# Patient Record
Sex: Female | Born: 1975 | Hispanic: No | Marital: Married | Smoking: Current every day smoker
Health system: Southern US, Community
[De-identification: ages and names within clinical notes are randomized; demographics above are authoritative.]

## PROBLEM LIST (undated history)

## (undated) DIAGNOSIS — J45909 Unspecified asthma, uncomplicated: Secondary | ICD-10-CM

---

## 2018-05-24 ENCOUNTER — Emergency Department (HOSPITAL_COMMUNITY): Payer: Self-pay

## 2018-05-24 ENCOUNTER — Emergency Department (HOSPITAL_COMMUNITY)
Admission: EM | Admit: 2018-05-24 | Discharge: 2018-05-24 | Disposition: A | Discharge status: Home / Self Care | Payer: Self-pay | Attending: Emergency Medicine | Admitting: Emergency Medicine

## 2018-05-24 DIAGNOSIS — F17210 Nicotine dependence, cigarettes, uncomplicated: Secondary | ICD-10-CM | POA: Insufficient documentation

## 2018-05-24 DIAGNOSIS — R10819 Abdominal tenderness, unspecified site: Secondary | ICD-10-CM | POA: Insufficient documentation

## 2018-05-24 DIAGNOSIS — R112 Nausea with vomiting, unspecified: Secondary | ICD-10-CM | POA: Insufficient documentation

## 2018-05-24 LAB — COMPREHENSIVE METABOLIC PANEL
ALT: 25 U/L (ref 0–44)
ANION GAP: 11 (ref 5–15)
AST: 23 U/L (ref 15–41)
Albumin: 4.3 g/dL (ref 3.5–5.0)
Alkaline Phosphatase: 44 U/L (ref 38–126)
BUN: 13 mg/dL (ref 6–20)
CHLORIDE: 109 mmol/L (ref 98–111)
CO2: 24 mmol/L (ref 22–32)
Calcium: 8.9 mg/dL (ref 8.9–10.3)
Creatinine, Ser: 0.59 mg/dL (ref 0.44–1.00)
GFR calc Af Amer: >60 mL/min (ref >60)
GFR calc non Af Amer: >60 mL/min (ref >60)
Glucose, Bld: 187 mg/dL — ABNORMAL HIGH (ref 70–99)
Potassium: 3.3 mmol/L — ABNORMAL LOW (ref 3.5–5.1)
SODIUM: 144 mmol/L (ref 135–145)
Total Bilirubin: 0.8 mg/dL (ref 0.3–1.2)
Total Protein: 7.3 g/dL (ref 6.5–8.1)

## 2018-05-24 LAB — I-STAT BETA HCG BLOOD, ED (MC, WL, AP ONLY): I-stat hCG, quantitative: <5 m[IU]/mL (ref <5)

## 2018-05-24 LAB — CBC
HCT: 38.9 % (ref 36.0–46.0)
HEMOGLOBIN: 13.4 g/dL (ref 12.0–15.0)
MCH: 31.8 pg (ref 26.0–34.0)
MCHC: 34.4 g/dL (ref 30.0–36.0)
MCV: 92.2 fL (ref 78.0–100.0)
Platelets: 256 10*3/uL (ref 150–400)
RBC: 4.22 MIL/uL (ref 3.87–5.11)
RDW: 13.4 % (ref 11.5–15.5)
WBC: 11 10*3/uL — ABNORMAL HIGH (ref 4.0–10.5)

## 2018-05-24 LAB — LIPASE, BLOOD: LIPASE: 29 U/L (ref 11–51)

## 2018-05-24 MED ORDER — ONDANSETRON HCL 4 MG PO TABS: 4.0000 mg | ORAL_TABLET | Freq: Four times a day (QID) | ORAL | 0 refills | Status: AC

## 2018-05-24 MED ORDER — MORPHINE SULFATE (PF) 4 MG/ML IV SOLN
4.0000 mg | Freq: Once | INTRAVENOUS | Status: AC
  Administered 2018-05-24: 4 mg via INTRAVENOUS
  Filled 2018-05-24: qty 1

## 2018-05-24 MED ORDER — ONDANSETRON HCL 4 MG/2ML IJ SOLN
4.0000 mg | Freq: Once | INTRAMUSCULAR | Status: DC | PRN
  Filled 2018-05-24: qty 2

## 2018-05-24 MED ORDER — ONDANSETRON HCL 4 MG/2ML IJ SOLN
4.0000 mg | Freq: Once | INTRAMUSCULAR | Status: AC
  Administered 2018-05-24: 4 mg via INTRAVENOUS

## 2018-05-24 MED ORDER — SODIUM CHLORIDE 0.9 % IV BOLUS
1000.0000 mL | Freq: Once | INTRAVENOUS | Status: AC
  Administered 2018-05-24: 1000 mL via INTRAVENOUS

## 2018-05-24 NOTE — ED Notes (Signed)
Bed: WA10 Expected date:  Expected time:  Means of arrival:  Comments: Ems  

## 2018-05-24 NOTE — ED Provider Notes (Signed)
[facility]-EMERGENCY DEPT Provider Note   CSN: [id number] Arrival date & time: 05/24/18  0502     History   Chief Complaint No chief complaint on file.   HPI Stephanie Lozano is a 42 y.o. female.  The history is provided by the patient and medical records. No language interpreter was used.     42 year old female presenting for evaluation of nausea vomiting and diarrhea.  Patient brought here via EMS from home.  Patient report for the past 2 to 3 days she has had persistent nausea and yesterday she vomited at least 10-15 times of bilious contents as well as having loose stool last night.  She endorsed subjective fever and chills, having pain in her chest, coughing, diffuse abdominal cramping and states she has been dry heaving.  Smoking cigarettes seems to make it worse.  Nothing seems to make it better.  Aside from taking baths, no other specific treatment tried.  Pain is moderate in severity.  She denies any recent alcohol use and history of diabetes.  Denies any recent travel.  States she has history of gastric ulcer but this felt different.  No past medical history on file.  There are no active problems to display for this patient.   The histories are not reviewed yet. Please review them in the "History" navigator section and refresh this SmartLink.   OB History   None      Home Medications    Prior to Admission medications   Not on File    Family History No family history on file.  Social History Social History   Tobacco Use  . Smoking status: Not on file  Substance Use Topics  . Alcohol use: Not on file  . Drug use: Not on file     Allergies   Patient has no known allergies.   Review of Systems Review of Systems  All other systems reviewed and are negative.    Physical Exam Updated Vital Signs BP (!) 158/112 (BP Location: Left Arm)   Pulse 61   Temp 97.9 F (36.6 C) (Axillary)   Resp 18   SpO2 99%   Physical Exam    Constitutional: She appears well-developed and well-nourished. No distress.  Obese female appears uncomfortable.  HENT:  Head: Atraumatic.  Eyes: Conjunctivae are normal.  Neck: Neck supple.  Cardiovascular: Normal rate and regular rhythm.  Pulmonary/Chest: Effort normal and breath sounds normal. No respiratory distress. She has no wheezes. She has no rales.  Abdominal: Soft. Bowel sounds are normal. She exhibits no distension. There is tenderness (Diffuse abdominal tenderness without focal point tenderness.).  Neurological: She is alert.  Skin: No rash noted.  Psychiatric: She has a normal mood and affect.  Nursing note and vitals reviewed.    ED Treatments / Results  Labs (all labs ordered are listed, but only abnormal results are displayed) Labs Reviewed  COMPREHENSIVE METABOLIC PANEL - Abnormal; Notable for the following components:      Result Value   Potassium 3.3 (*)    Glucose, Bld 187 (*)    All other components within normal limits  CBC - Abnormal; Notable for the following components:   WBC 11.0 (*)    All other components within normal limits  LIPASE, BLOOD  URINALYSIS, ROUTINE W REFLEX MICROSCOPIC  I-STAT BETA HCG BLOOD, ED ([facility], [facility], [facility] ONLY)    EKG None  ED ECG REPORT   Date: 05/24/2018  Rate: 55  Rhythm: normal sinus rhythm  QRS Axis:  normal  Intervals: normal  ST/T Wave abnormalities: nonspecific ST changes  Conduction Disutrbances:none  Narrative Interpretation:   Old EKG Reviewed: unchanged  I have personally reviewed the EKG tracing and agree with the computerized printout as noted.   Radiology US Abdomen Limited  Result Date: 05/24/2018 CLINICAL DATA:  Bilious vomiting and nausea EXAM: ULTRASOUND ABDOMEN LIMITED RIGHT UPPER QUADRANT COMPARISON:  None. FINDINGS: Gallbladder: No gallstones or wall thickening visualized. There is no pericholecystic fluid. No sonographic Murphy sign noted by sonographer. Common bile duct: Diameter: 4 mm. No  intrahepatic or extrahepatic biliary duct dilatation. Liver: No focal lesion identified. Liver echogenicity is overall increased and somewhat inhomogeneous. Note that the portal vein is not well visualized due to patient's inability to suspend respiration sufficient to assess Doppler flow in the portal vein. IMPRESSION: The appearance of the liver is felt to be indicative of a degree of hepatic steatosis. While no focal liver lesions are evident on this study, it must be cautioned that the sensitivity of ultrasound for detection of focal liver lesions is diminished in this circumstance. The patient was not able to suspend respiration sufficient to adequately study the portal vein. Study otherwise unremarkable. Electronically Signed   By: Bretta Bang III M.D.   On: 05/24/2018 07:53    Procedures Procedures (including critical care time)  Medications Ordered in ED Medications  ondansetron (ZOFRAN) injection 4 mg (has no administration in time range)     Initial Impression / Assessment and Plan / ED Course  I have reviewed the triage vital signs and the nursing notes.  Pertinent labs & imaging results that were available during my care of the patient were reviewed by me and considered in my medical decision making (see chart for details).     BP (!) 158/112 (BP Location: Left Arm)   Pulse 61   Temp 97.9 F (36.6 C) (Axillary)   Resp 18   SpO2 99%    Final Clinical Impressions(s) / ED Diagnoses   Final diagnoses:  Non-intractable vomiting with nausea, unspecified vomiting type    ED Discharge Orders         Ordered    ondansetron (ZOFRAN) 4 MG tablet  Every 6 hours     05/24/18 0820         6:18 AM Patient here with abdominal pain associate nausea vomiting diarrhea.  Pain is not localized on exam, patient appears uncomfortable but nontoxic.  Work-up initiated.  Will provide treatment.  8:07 AM Pregnancy test is negative, normal lipase, electrolytes panels are  reassuring, mild hypokalemia with a potassium of 3.3.  Mild hyperglycemia with a CBG of 187.  WBC unremarkable.  Limited abdominal ultrasound was performed without any biliary disease.  Some evidence of hepatic steatosis.  On reexamination, patient reported feeling better.  She is able to tolerate p.o.  She denies history of alcohol abuse or history of pancreatitis.  No history of diabetes.  I discussed the finding on the abdominal ultrasound encourage patient to find a primary care provider for further evaluation.  I felt patient is stable for discharge with antinausea medication and return precaution were discussed.    Fayrene Helper, PA-C 05/24/18 8469    Derwood Kaplan, MD 05/25/18 [phone]

## 2018-05-24 NOTE — ED Triage Notes (Addendum)
Pt BIB EMS for N/V x3 days. Patient states she has been dizzy and dry heaving.

## 2018-05-24 NOTE — ED Notes (Signed)
RN came back from taking a pt upstairs and found pt to not be in room. RN searched the department, attempted to call pt on the cell number listed in chart, and searched the lobby without finding pt. RN had no done d/c vitals, given d/c papers or taken out IV. Pt was seen by secretary walking towards the doors saying she was going to the doors where her husband was and "would be right back". Secretary tried to stop pt.

## 2018-05-27 ENCOUNTER — Emergency Department (HOSPITAL_COMMUNITY): Payer: Self-pay

## 2018-05-27 ENCOUNTER — Emergency Department (HOSPITAL_COMMUNITY)
Admission: EM | Admit: 2018-05-27 | Discharge: 2018-05-27 | Disposition: A | Discharge status: Home / Self Care | Payer: Self-pay | Attending: Emergency Medicine | Admitting: Emergency Medicine

## 2018-05-27 ENCOUNTER — Encounter (HOSPITAL_COMMUNITY): Payer: Self-pay

## 2018-05-27 ENCOUNTER — Other Ambulatory Visit: Payer: Self-pay

## 2018-05-27 DIAGNOSIS — J45909 Unspecified asthma, uncomplicated: Secondary | ICD-10-CM | POA: Insufficient documentation

## 2018-05-27 DIAGNOSIS — Z79899 Other long term (current) drug therapy: Secondary | ICD-10-CM | POA: Insufficient documentation

## 2018-05-27 DIAGNOSIS — R112 Nausea with vomiting, unspecified: Secondary | ICD-10-CM | POA: Insufficient documentation

## 2018-05-27 HISTORY — DX: Unspecified asthma, uncomplicated: J45.909

## 2018-05-27 LAB — COMPREHENSIVE METABOLIC PANEL
ALT: 44 U/L (ref 0–44)
AST: 36 U/L (ref 15–41)
Albumin: 4.4 g/dL (ref 3.5–5.0)
Alkaline Phosphatase: 50 U/L (ref 38–126)
Anion gap: 11 (ref 5–15)
BUN: 12 mg/dL (ref 6–20)
CALCIUM: 9.2 mg/dL (ref 8.9–10.3)
CO2: 27 mmol/L (ref 22–32)
CREATININE: 0.67 mg/dL (ref 0.44–1.00)
Chloride: 100 mmol/L (ref 98–111)
Glucose, Bld: 116 mg/dL — ABNORMAL HIGH (ref 70–99)
Potassium: 3.3 mmol/L — ABNORMAL LOW (ref 3.5–5.1)
Sodium: 138 mmol/L (ref 135–145)
Total Bilirubin: 0.9 mg/dL (ref 0.3–1.2)
Total Protein: 7.9 g/dL (ref 6.5–8.1)

## 2018-05-27 LAB — CBC WITH DIFFERENTIAL/PLATELET
Abs Immature Granulocytes: 0 10*3/uL (ref 0.0–0.1)
BASOS PCT: 0 %
Basophils Absolute: 0 10*3/uL (ref 0.0–0.1)
EOS ABS: 0 10*3/uL (ref 0.0–0.7)
Eosinophils Relative: 0 %
HCT: 47 % — ABNORMAL HIGH (ref 36.0–46.0)
Hemoglobin: 15.9 g/dL — ABNORMAL HIGH (ref 12.0–15.0)
IMMATURE GRANULOCYTES: 0 %
Lymphocytes Relative: 30 %
Lymphs Abs: 3.3 10*3/uL (ref 0.7–4.0)
MCH: 31.1 pg (ref 26.0–34.0)
MCHC: 33.8 g/dL (ref 30.0–36.0)
MCV: 92 fL (ref 78.0–100.0)
MONOS PCT: 9 %
Monocytes Absolute: 1 10*3/uL (ref 0.1–1.0)
NEUTROS PCT: 59 %
Neutro Abs: 6.5 10*3/uL (ref 1.7–7.7)
Platelets: 305 10*3/uL (ref 150–400)
RBC: 5.11 MIL/uL (ref 3.87–5.11)
RDW: 12.5 % (ref 11.5–15.5)
WBC: 11 10*3/uL — AB (ref 4.0–10.5)

## 2018-05-27 LAB — POC URINE PREG, ED: Preg Test, Ur: NEGATIVE

## 2018-05-27 LAB — URINALYSIS, ROUTINE W REFLEX MICROSCOPIC
Bilirubin Urine: NEGATIVE
GLUCOSE, UA: NEGATIVE mg/dL
Hgb urine dipstick: NEGATIVE
Ketones, ur: 5 mg/dL — AB
LEUKOCYTES UA: NEGATIVE
NITRITE: NEGATIVE
PH: 7 (ref 5.0–8.0)
Protein, ur: NEGATIVE mg/dL
SPECIFIC GRAVITY, URINE: 1.023 (ref 1.005–1.030)

## 2018-05-27 LAB — I-STAT TROPONIN, ED: Troponin i, poc: 0.03 ng/mL (ref 0.00–0.08)

## 2018-05-27 LAB — LIPASE, BLOOD: LIPASE: 31 U/L (ref 11–51)

## 2018-05-27 MED ORDER — ONDANSETRON 4 MG PO TBDP: 4.0000 mg | ORAL_TABLET | Freq: Three times a day (TID) | ORAL | 0 refills | Status: AC | PRN

## 2018-05-27 MED ORDER — IOPAMIDOL (ISOVUE-300) INJECTION 61%
100.0000 mL | Freq: Once | INTRAVENOUS | Status: AC | PRN
  Administered 2018-05-27: 100 mL via INTRAVENOUS

## 2018-05-27 MED ORDER — ONDANSETRON HCL 4 MG/2ML IJ SOLN
4.0000 mg | Freq: Once | INTRAMUSCULAR | Status: AC
  Administered 2018-05-27: 4 mg via INTRAVENOUS
  Filled 2018-05-27: qty 2

## 2018-05-27 MED ORDER — IOPAMIDOL (ISOVUE-300) INJECTION 61%
INTRAVENOUS | Status: AC
  Filled 2018-05-27: qty 100

## 2018-05-27 MED ORDER — FAMOTIDINE IN NACL 20-0.9 MG/50ML-% IV SOLN
20.0000 mg | Freq: Once | INTRAVENOUS | Status: AC
  Administered 2018-05-27: 20 mg via INTRAVENOUS
  Filled 2018-05-27: qty 50

## 2018-05-27 MED ORDER — FAMOTIDINE 20 MG PO TABS: 20.0000 mg | ORAL_TABLET | Freq: Two times a day (BID) | ORAL | 0 refills | Status: AC

## 2018-05-27 MED ORDER — MORPHINE SULFATE (PF) 4 MG/ML IV SOLN
4.0000 mg | Freq: Once | INTRAVENOUS | Status: AC
  Administered 2018-05-27: 4 mg via INTRAVENOUS
  Filled 2018-05-27: qty 1

## 2018-05-27 MED ORDER — SUCRALFATE 1 GM/10ML PO SUSP: 1.0000 g | Freq: Three times a day (TID) | ORAL | 0 refills | Status: AC

## 2018-05-27 MED ORDER — SODIUM CHLORIDE 0.9 % IV BOLUS
1000.0000 mL | Freq: Once | INTRAVENOUS | Status: AC
  Administered 2018-05-27: 1000 mL via INTRAVENOUS

## 2018-05-27 NOTE — ED Triage Notes (Signed)
Patient was at [facility] on Tuesday and was given Zofran for upset stomach. Today is c/o N/V and right-sided chest pain x 3 days. 4mg  Zofran IM given.

## 2018-05-27 NOTE — ED Provider Notes (Signed)
[facility] EMERGENCY DEPARTMENT Provider Note   CSN: [id number] Arrival date & time: 05/27/18  6861     History   Chief Complaint Chief Complaint  Patient presents with  . Chest Pain    HPI Stephanie Lozano is a 42 y.o. female.  The history is provided by the patient. No language interpreter was used.  Chest Pain     Stephanie Lozano is a 42 y.o. female who presents to the Emergency Department complaining of abdomina pain. Presents to the emergency department complaining of five days of epigastric abdominal pain with nausea and vomiting. She endorses numerous episodes of emesis for the last five days, nonbloody. She endorses constipation for the last four days. Three days ago she developed some central chest heaviness. Her upper abdominal pain is described as a burning and constant nature. She denies any fevers, dysuria. She has been told she has ulcers in the past on ED visit a year ago and was treated with antibiotics at that time. She has never been seen by [facility] and has never had an endoscopy. She currently takes no medications. She is a smoker. She denies any alcohol or drug use. She does endorse having a lot of stress in her life currently. She recently moved to the [place] area three weeks ago.  Denies lower abdominal pain, vaginal discharge.   Past Medical History:  Diagnosis Date  . Asthma     There are no active problems to display for this patient.   History reviewed. No pertinent surgical history.   OB History   None      Home Medications    Prior to Admission medications   Medication Sig Start Date End Date Taking? Authorizing Provider  acetaminophen (TYLENOL) 500 MG tablet Take 1,000 mg by mouth every 6 (six) hours as needed for mild pain or headache.   Yes [provider]  bismuth subsalicylate (PEPTO BISMOL) 262 MG chewable tablet Chew 524 mg by mouth as needed for indigestion.   Yes [provider]  ondansetron (ZOFRAN) 4 MG  tablet Take 1 tablet (4 mg total) by mouth every 6 (six) hours. Patient taking differently: Take 4 mg by mouth every 6 (six) hours as needed for nausea or vomiting.  05/24/18  Yes Fayrene Helper, PA-C  vitamin B-12 (CYANOCOBALAMIN) 100 MCG tablet Take 100 mcg by mouth daily.   Yes [provider]  famotidine (PEPCID) 20 MG tablet Take 1 tablet (20 mg total) by mouth 2 (two) times daily. 05/27/18   Tilden Fossa, MD  ondansetron (ZOFRAN ODT) 4 MG disintegrating tablet Take 1 tablet (4 mg total) by mouth every 8 (eight) hours as needed for nausea or vomiting. 05/27/18   Tilden Fossa, MD  sucralfate (CARAFATE) 1 GM/10ML suspension Take 10 mLs (1 g total) by mouth 4 (four) times daily -  with meals and at bedtime. 05/27/18   Tilden Fossa, MD    Family History No family history on file.  Social History Social History   Tobacco Use  . Smoking status: Not on file  Substance Use Topics  . Alcohol use: Not on file  . Drug use: Not on file     Allergies   Patient has no known allergies.   Review of Systems Review of Systems  Cardiovascular: Positive for chest pain.  All other systems reviewed and are negative.    Physical Exam Updated Vital Signs BP 119/75 (BP Location: Right Arm)   Pulse 62   Temp 97.9 F (36.6  C)   Resp 20   Ht 5\' 3"  (1.6 m)   Wt 74.8 kg   LMP 05/23/2018   SpO2 99%   BMI 29.23 kg/m   Physical Exam  Constitutional: She is oriented to person, place, and time. She appears well-developed and well-nourished.  Uncomfortable appearing  HENT:  Head: Normocephalic and atraumatic.  Cardiovascular: Normal rate and regular rhythm.  No murmur heard. Pulmonary/Chest: Effort normal and breath sounds normal. No respiratory distress.  Abdominal: Soft. There is no rebound and no guarding.  Moderate upper abdominal tenderness without any guarding or rebound  Musculoskeletal: She exhibits no edema or tenderness.  Neurological: She is alert and oriented to person,  place, and time.  Skin: Skin is warm and dry.  Psychiatric: She has a normal mood and affect. Her behavior is normal.  Nursing note and vitals reviewed.    ED Treatments / Results  Labs (all labs ordered are listed, but only abnormal results are displayed) Labs Reviewed  COMPREHENSIVE METABOLIC PANEL - Abnormal; Notable for the following components:      Result Value   Potassium 3.3 (*)    Glucose, Bld 116 (*)    All other components within normal limits  CBC WITH DIFFERENTIAL/PLATELET - Abnormal; Notable for the following components:   WBC 11.0 (*)    Hemoglobin 15.9 (*)    HCT 47.0 (*)    All other components within normal limits  URINALYSIS, ROUTINE W REFLEX MICROSCOPIC - Abnormal; Notable for the following components:   APPearance HAZY (*)    Ketones, ur 5 (*)    All other components within normal limits  LIPASE, BLOOD  I-STAT TROPONIN, ED  POC URINE PREG, ED    EKG EKG Interpretation  Date/Time:  Saturday May 27 2018 08:15:18 EDT Ventricular Rate:  59 PR Interval:    QRS Duration: 84 QT Interval:  474 QTC Calculation: 470 R Axis:   71 Text Interpretation:  Sinus rhythm Borderline short PR interval Confirmed by Tilden Fossa [phone]) on 05/27/2018 8:26:44 AM   Radiology Ct Abdomen Pelvis W Contrast  Result Date: 05/27/2018 CLINICAL DATA:  Vomiting for 4 days. EXAM: CT ABDOMEN AND PELVIS WITH CONTRAST TECHNIQUE: Multidetector CT imaging of the abdomen and pelvis was performed using the standard protocol following bolus administration of intravenous contrast. CONTRAST:  ISOVUE-300 IOPAMIDOL (ISOVUE-300) INJECTION 61% COMPARISON:  None. FINDINGS: Lower chest: No acute abnormality. Hepatobiliary: Hepatic steatosis. Portal vein and gallbladder are unremarkable. Pancreas: Unremarkable. No pancreatic ductal dilatation or surrounding inflammatory changes. Spleen: Normal in size without focal abnormality. Adrenals/Urinary Tract: Adrenal glands are unremarkable.  Kidneys are normal, without renal calculi, focal lesion, or hydronephrosis. Bladder is unremarkable. Stomach/Bowel: The stomach and small bowel are normal. The colon and appendix are unremarkable. Vascular/Lymphatic: Mild atherosclerotic changes in the nonaneurysmal aorta. Atherosclerosis extends into the right common iliac artery without significant stenosis. No adenopathy identified. Reproductive: The uterus is unremarkable. The cervix is somewhat prominent on axial imaging, thought to be due to the angle of the fornix based on sagittal imaging. Mildly prominent vessels in the pelvis. The ovaries are unremarkable. Other: Minimal free fluid in the pelvis is likely physiologic. Musculoskeletal: No acute or significant osseous findings. IMPRESSION: 1. No cause for the patient's abdominal pain identified. 2. Hepatic steatosis. 3. Mild atherosclerotic changes in the nonaneurysmal aorta. 4. The cervix is somewhat prominent on axial imaging, thought to be due to the angle the fornix based on sagittal imaging and of no acute significance. Recommend clinical correlation. 5.  Minimal fluid in the pelvis is likely physiologic. Electronically Signed   By: Gerome Sam III M.D   On: 05/27/2018 14:14    Procedures Procedures (including critical care time)  Medications Ordered in ED Medications  iopamidol (ISOVUE-300) 61 % injection (has no administration in time range)  sodium chloride 0.9 % bolus 1,000 mL (0 mLs Intravenous Stopped 05/27/18 1045)  ondansetron (ZOFRAN) injection 4 mg (4 mg Intravenous Given 05/27/18 0922)  famotidine (PEPCID) IVPB 20 mg premix (0 mg Intravenous Stopped 05/27/18 1015)  morphine 4 MG/ML injection 4 mg (4 mg Intravenous Given 05/27/18 0922)  iopamidol (ISOVUE-300) 61 % injection 100 mL (100 mLs Intravenous Contrast Given 05/27/18 1318)     Initial Impression / Assessment and Plan / ED Course  I have reviewed the triage vital signs and the nursing notes.  Pertinent labs & imaging  results that were available during my care of the patient were reviewed by me and considered in my medical decision making (see chart for details).     Here for evaluation of epigastric abdominal pain, vomiting. She is non-toxic appearing on examination. She does have moderate epigastric tenderness on examination without peritoneal findings. CT scan obtained to rule out obstruction. CT scan without acute abnormalities. Current presentation is not consistent with bowel obstruction, PID, torsion, pancreatitis. She is feeling improved following treatment in the department. Discussed home care for gastritis. Discussed outpatient follow-up and return precautions.  Final Clinical Impressions(s) / ED Diagnoses   Final diagnoses:  Non-intractable vomiting with nausea, unspecified vomiting type    ED Discharge Orders         Ordered    famotidine (PEPCID) 20 MG tablet  2 times daily     05/27/18 1439    ondansetron (ZOFRAN ODT) 4 MG disintegrating tablet  Every 8 hours PRN     05/27/18 1439    sucralfate (CARAFATE) 1 GM/10ML suspension  3 times daily with meals & bedtime     05/27/18 1439           Tilden Fossa, MD 05/27/18 1616

## 2018-05-27 NOTE — ED Notes (Signed)
ED Provider at bedside. 

## 2018-05-27 NOTE — ED Notes (Signed)
Patient walked to bathroom to give urine sample 

## 2018-05-27 NOTE — ED Notes (Signed)
D/c reviewed with patient 

## 2018-06-07 ENCOUNTER — Other Ambulatory Visit: Payer: Self-pay

## 2018-06-07 ENCOUNTER — Encounter (HOSPITAL_COMMUNITY): Payer: Self-pay | Admitting: Emergency Medicine

## 2018-06-07 ENCOUNTER — Emergency Department (HOSPITAL_COMMUNITY)
Admission: EM | Admit: 2018-06-07 | Discharge: 2018-06-07 | Discharge status: Against Medical Advice | Payer: Medicaid Other | Attending: Emergency Medicine | Admitting: Emergency Medicine

## 2018-06-07 DIAGNOSIS — R1010 Upper abdominal pain, unspecified: Secondary | ICD-10-CM

## 2018-06-07 DIAGNOSIS — J45909 Unspecified asthma, uncomplicated: Secondary | ICD-10-CM | POA: Insufficient documentation

## 2018-06-07 DIAGNOSIS — G43A1 Cyclical vomiting, intractable: Secondary | ICD-10-CM | POA: Insufficient documentation

## 2018-06-07 DIAGNOSIS — Z5329 Procedure and treatment not carried out because of patient's decision for other reasons: Secondary | ICD-10-CM | POA: Insufficient documentation

## 2018-06-07 DIAGNOSIS — Z79899 Other long term (current) drug therapy: Secondary | ICD-10-CM | POA: Insufficient documentation

## 2018-06-07 DIAGNOSIS — F17210 Nicotine dependence, cigarettes, uncomplicated: Secondary | ICD-10-CM | POA: Insufficient documentation

## 2018-06-07 DIAGNOSIS — R1115 Cyclical vomiting syndrome unrelated to migraine: Secondary | ICD-10-CM

## 2018-06-07 LAB — URINALYSIS, ROUTINE W REFLEX MICROSCOPIC
Bilirubin Urine: NEGATIVE
Glucose, UA: NEGATIVE mg/dL
Hgb urine dipstick: NEGATIVE
KETONES UR: NEGATIVE mg/dL
Leukocytes, UA: NEGATIVE
Nitrite: NEGATIVE
Protein, ur: 100 mg/dL — AB
Specific Gravity, Urine: 1.023 (ref 1.005–1.030)
pH: 8 (ref 5.0–8.0)

## 2018-06-07 LAB — I-STAT BETA HCG BLOOD, ED (MC, WL, AP ONLY): I-stat hCG, quantitative: <5 m[IU]/mL

## 2018-06-07 LAB — LIPASE, BLOOD: LIPASE: 24 U/L (ref 11–51)

## 2018-06-07 LAB — COMPREHENSIVE METABOLIC PANEL WITH GFR
ALT: 44 U/L (ref 0–44)
AST: 37 U/L (ref 15–41)
Albumin: 4.7 g/dL (ref 3.5–5.0)
Alkaline Phosphatase: 53 U/L (ref 38–126)
Anion gap: 13 (ref 5–15)
BUN: 10 mg/dL (ref 6–20)
CO2: 28 mmol/L (ref 22–32)
Calcium: 10.3 mg/dL (ref 8.9–10.3)
Chloride: 97 mmol/L — ABNORMAL LOW (ref 98–111)
Creatinine, Ser: 0.67 mg/dL (ref 0.44–1.00)
GFR calc Af Amer: >60 mL/min
GFR calc non Af Amer: >60 mL/min
Glucose, Bld: 137 mg/dL — ABNORMAL HIGH (ref 70–99)
Potassium: 3.8 mmol/L (ref 3.5–5.1)
Sodium: 138 mmol/L (ref 135–145)
Total Bilirubin: 0.9 mg/dL (ref 0.3–1.2)
Total Protein: 8.7 g/dL — ABNORMAL HIGH (ref 6.5–8.1)

## 2018-06-07 LAB — CBC
HCT: 46.6 % — ABNORMAL HIGH (ref 36.0–46.0)
HEMOGLOBIN: 16.1 g/dL — AB (ref 12.0–15.0)
MCH: 31.6 pg (ref 26.0–34.0)
MCHC: 34.5 g/dL (ref 30.0–36.0)
MCV: 91.4 fL (ref 78.0–100.0)
Platelets: 314 10*3/uL (ref 150–400)
RBC: 5.1 MIL/uL (ref 3.87–5.11)
RDW: 12.9 % (ref 11.5–15.5)
WBC: 15.6 10*3/uL — ABNORMAL HIGH (ref 4.0–10.5)

## 2018-06-07 MED ORDER — SODIUM CHLORIDE 0.9 % IV SOLN: INTRAVENOUS | Status: DC

## 2018-06-07 MED ORDER — MORPHINE SULFATE (PF) 2 MG/ML IV SOLN
2.0000 mg | Freq: Once | INTRAVENOUS | Status: DC
  Filled 2018-06-07: qty 1

## 2018-06-07 MED ORDER — SODIUM CHLORIDE 0.9 % IV BOLUS: 1000.0000 mL | Freq: Once | INTRAVENOUS | Status: DC

## 2018-06-07 MED ORDER — ONDANSETRON HCL 4 MG/2ML IJ SOLN
4.0000 mg | Freq: Once | INTRAMUSCULAR | Status: DC
  Filled 2018-06-07: qty 2

## 2018-06-07 NOTE — ED Notes (Signed)
Pt requesting nausea meds and ice.

## 2018-06-07 NOTE — ED Provider Notes (Signed)
[facility] EMERGENCY DEPARTMENT Provider Note   CSN: [id number] Arrival date & time: 06/07/18  1019     History   Chief Complaint Chief Complaint  Patient presents with  . Abdominal Pain    HPI Stephanie Lozano is a 42 y.o. female.  Patient with a history of cyclical vomiting type syndrome.  With a complaint of abdominal pain upper quadrant radiating into the right side of the abdomen including right lower quadrant.  Patient brought in by EMS.  States symptoms been going on for 2 weeks.  Patient states that she was diagnosed with gastric ulcers.  States that there was no gross pooling of blood but had some streaking of red blood.  EMS gave patient 8 mg of Zofran.  Patient most recently seen September 7 and September 4 for essentially the same complaint.  Patient seems to respond to morphine IV and Zofran.  IV fluids and these were ordered.  Patient had a recent CT scan of the abdomen on the seventh which had no acute findings.  Patient states that she is been vomiting nonstop for 4 days.     Past Medical History:  Diagnosis Date  . Asthma     There are no active problems to display for this patient.   History reviewed. No pertinent surgical history.   OB History   None      Home Medications    Prior to Admission medications   Medication Sig Start Date End Date Taking? Authorizing Provider  acetaminophen (TYLENOL) 500 MG tablet Take 500 mg by mouth every 6 (six) hours as needed for mild pain or headache.    Yes [provider]  alum hydroxide-mag trisilicate (GAVISCON) 80-20 MG CHEW chewable tablet Chew 1 tablet by mouth as needed for indigestion or heartburn.   Yes [provider]  cimetidine (TAGAMET) 200 MG tablet Take 200 mg by mouth as needed.   Yes [provider]  vitamin B-12 (CYANOCOBALAMIN) 100 MCG tablet Take 100 mcg by mouth daily.   Yes [provider]  famotidine (PEPCID) 20 MG tablet Take 1 tablet (20 mg  total) by mouth 2 (two) times daily. Patient not taking: Reported on 06/07/2018 05/27/18   Tilden Fossa, MD  ondansetron (ZOFRAN ODT) 4 MG disintegrating tablet Take 1 tablet (4 mg total) by mouth every 8 (eight) hours as needed for nausea or vomiting. Patient not taking: Reported on 06/07/2018 05/27/18   Tilden Fossa, MD  ondansetron (ZOFRAN) 4 MG tablet Take 1 tablet (4 mg total) by mouth every 6 (six) hours. Patient not taking: Reported on 06/07/2018 05/24/18   Fayrene Helper, PA-C  sucralfate (CARAFATE) 1 GM/10ML suspension Take 10 mLs (1 g total) by mouth 4 (four) times daily -  with meals and at bedtime. Patient not taking: Reported on 06/07/2018 05/27/18   Tilden Fossa, MD    Family History No family history on file.  Social History Social History   Tobacco Use  . Smoking status: Current Every Day Smoker    Packs/day: 0.50    Types: Cigarettes  Substance Use Topics  . Alcohol use: Yes    Frequency: Never  . Drug use: Never     Allergies   Patient has no known allergies.   Review of Systems Review of Systems  Constitutional: Negative for fever.  HENT: Negative for congestion.   Eyes: Negative for redness.  Respiratory: Negative for shortness of breath.   Cardiovascular: Negative for chest pain.  Gastrointestinal: Positive for abdominal  pain, nausea and vomiting. Negative for diarrhea.  Genitourinary: Negative for dysuria.  Musculoskeletal: Positive for back pain.  Skin: Negative for rash.  Neurological: Negative for syncope.  Hematological: Does not bruise/bleed easily.  Psychiatric/Behavioral: Negative for confusion.     Physical Exam Updated Vital Signs BP (!) 134/91   Pulse 79   Temp 98.1 F (36.7 C) (Oral)   Resp (!) 22   Ht 1.6 m (5\' 3" )   Wt 74.8 kg   LMP 05/23/2018   SpO2 96%   BMI 29.23 kg/m   Physical Exam  Constitutional: She is oriented to person, place, and time. She appears well-developed and well-nourished. No distress.  HENT:  Head:  Normocephalic and atraumatic.  Mouth/Throat: Oropharynx is clear and moist.  Even with a history of the frequent vomiting.  Patient's mucous membranes were moist.  Eyes: Pupils are equal, round, and reactive to light. Conjunctivae and EOM are normal.  Neck: Neck supple.  Cardiovascular: Normal rate, regular rhythm and normal heart sounds.  Pulmonary/Chest: Effort normal and breath sounds normal. No respiratory distress.  Abdominal: Soft. Bowel sounds are normal. There is no tenderness.  Neurological: She is alert and oriented to person, place, and time. No cranial nerve deficit or sensory deficit. She exhibits normal muscle tone.  Skin: Skin is warm.  Nursing note and vitals reviewed.    ED Treatments / Results  Labs (all labs ordered are listed, but only abnormal results are displayed) Labs Reviewed  COMPREHENSIVE METABOLIC PANEL - Abnormal; Notable for the following components:      Result Value   Chloride 97 (*)    Glucose, Bld 137 (*)    Total Protein 8.7 (*)    All other components within normal limits  CBC - Abnormal; Notable for the following components:   WBC 15.6 (*)    Hemoglobin 16.1 (*)    HCT 46.6 (*)    All other components within normal limits  URINALYSIS, ROUTINE W REFLEX MICROSCOPIC - Abnormal; Notable for the following components:   Color, Urine AMBER (*)    APPearance HAZY (*)    Protein, ur 100 (*)    Bacteria, UA FEW (*)    All other components within normal limits  LIPASE, BLOOD  I-STAT BETA HCG BLOOD, ED ([facility], [facility], [facility] ONLY)    EKG EKG Interpretation  Date/Time:  Wednesday June 07 2018 10:26:08 EDT Ventricular Rate:  80 PR Interval:    QRS Duration: 87 QT Interval:  403 QTC Calculation: 465 R Axis:   74 Text Interpretation:  Sinus rhythm Confirmed by Vanetta Mulders [phone]) on 06/07/2018 11:45:15 AM   Radiology No results found.  Procedures Procedures (including critical care time)  Medications Ordered in ED Medications - No data to  display   Initial Impression / Assessment and Plan / ED Course  I have reviewed the triage vital signs and the nursing notes.  Pertinent labs & imaging results that were available during my care of the patient were reviewed by me and considered in my medical decision making (see chart for details).     Labs significant for leukocytosis.  No significant electrolyte abnormalities.  Urinalysis negative for urinary tract infection.  EKG without acute findings.  Patient's hemoglobin of note was 16 so she probably was concentrated.  Patient had these medications ordered never received them she he had eloped prior to the nurse being able to give them to her.  Final Clinical Impressions(s) / ED Diagnoses   Final diagnoses:  Pain of  upper abdomen  Intractable cyclical vomiting with nausea    ED Discharge Orders    None       Vanetta MuldersZackowski, Ava Deguire, MD 06/07/18 1700

## 2018-06-07 NOTE — ED Notes (Signed)
This RN went in pt's room and pt was gone. Secretary informed this RN that pt told her she was leaving. Notified MD.

## 2018-06-07 NOTE — ED Triage Notes (Signed)
Pt brought in by [facility] with complaints of abdominal pain and NVD x2 weeks. Pt was diagnosed with gastric ulcers. [facility]. Pt has a followup appointment but cant be seen until October. Complaints of back pain and chest pain believed to be linked by NVD. Given 8mg  zofran by EMS. BP 159/101, HR 86, O2 97, RR18, CBG 146

## 2020-02-12 IMAGING — US US ABDOMEN LIMITED
1 series · 14 of 25 positions shown · non-contrast
Comparison: None.

CLINICAL DATA: Bilious vomiting and nausea

EXAM:
ULTRASOUND ABDOMEN LIMITED RIGHT UPPER QUADRANT

[Series 1: us abdomen limited · 0.23mm/px · 14 of 33 slices shown]
[im 1/33]
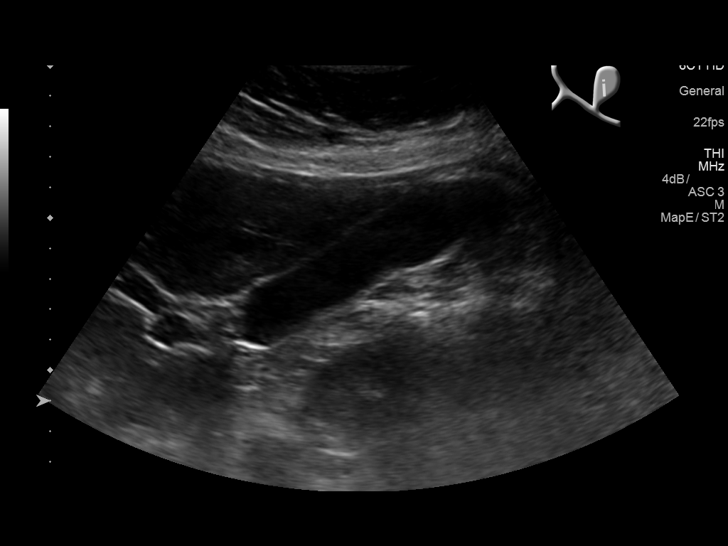
[im 3/33]
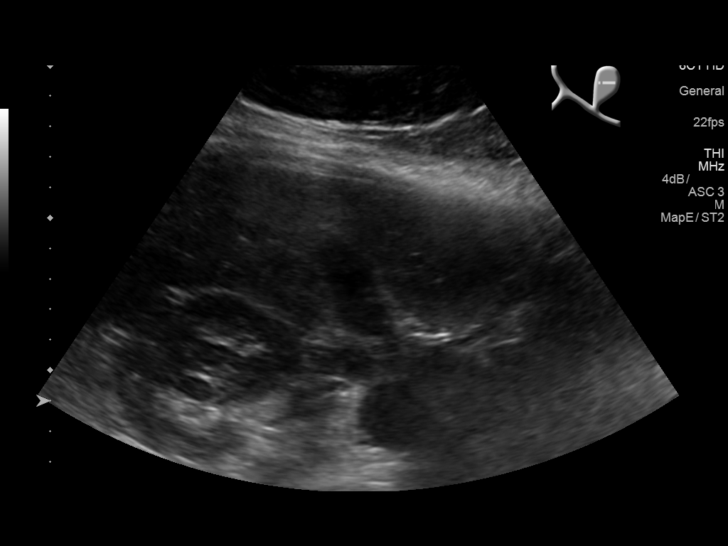
[im 6/33]
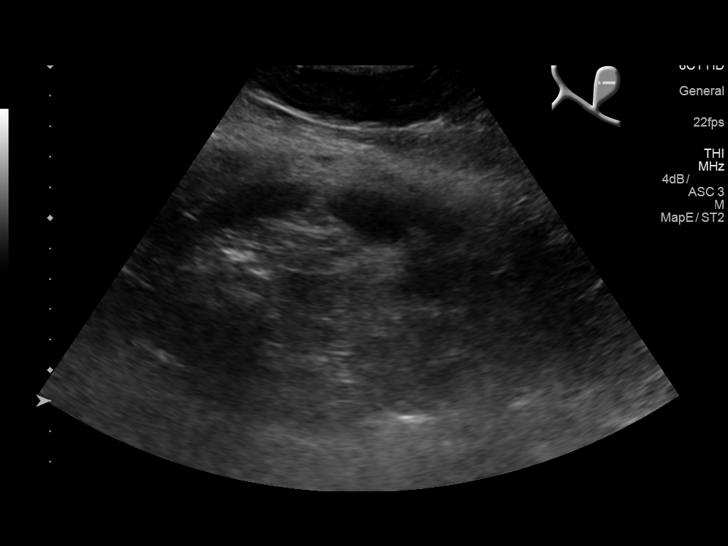
[im 9/33]
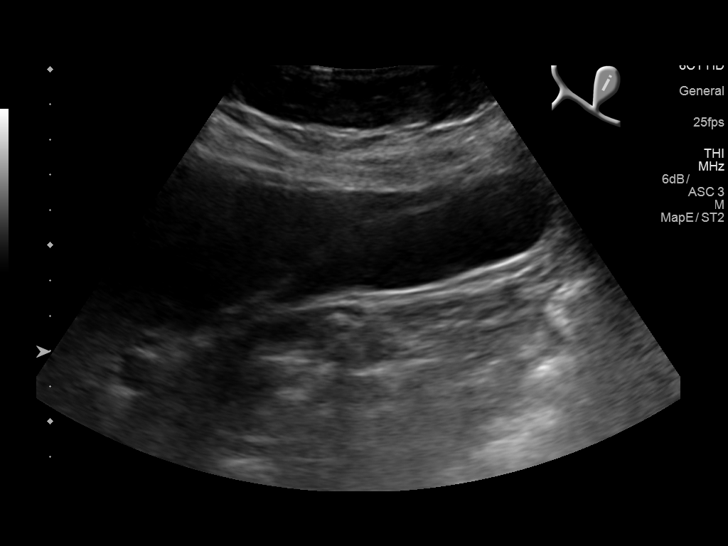
[im 11/33]
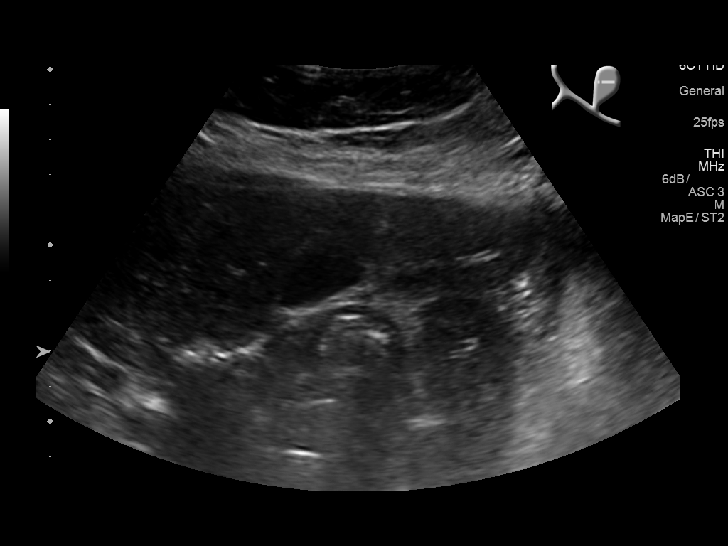
[im 13/33]
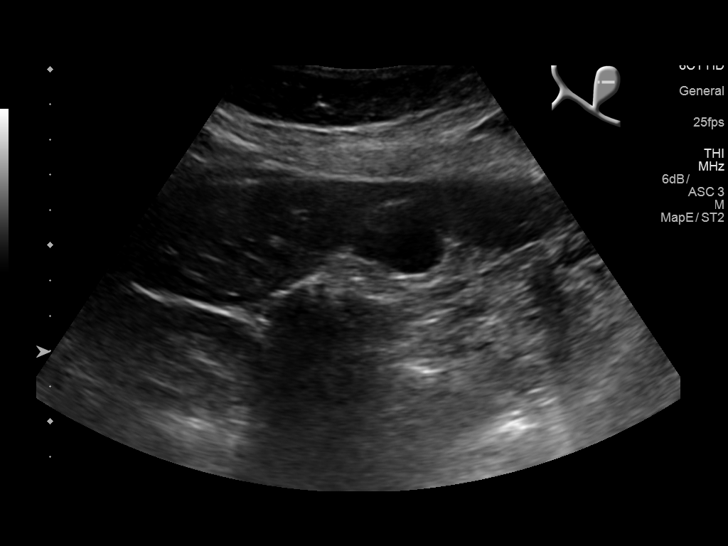
[im 15/33]
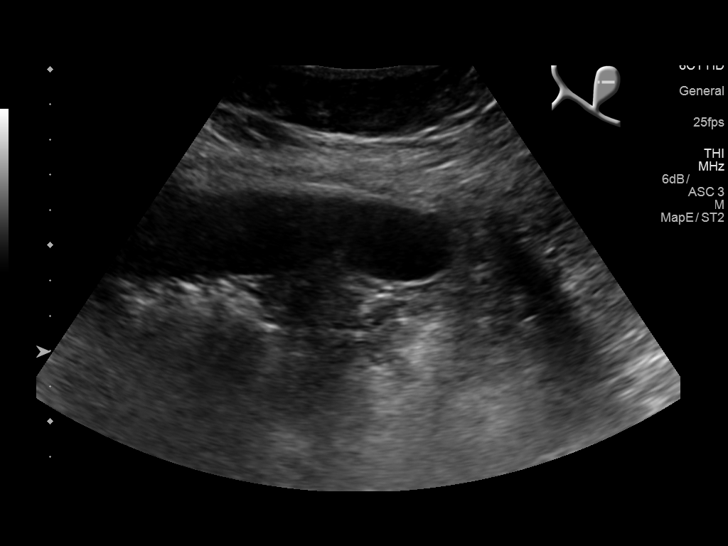
[im 18/33]
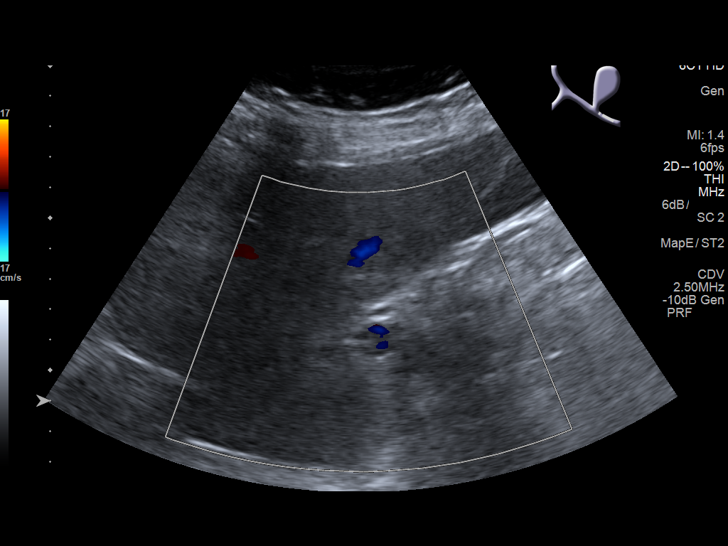
[im 21/33]
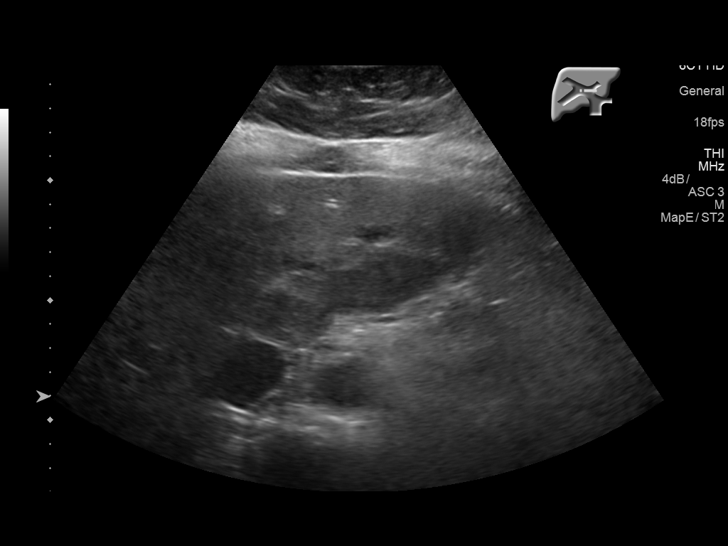
[im 22/33]
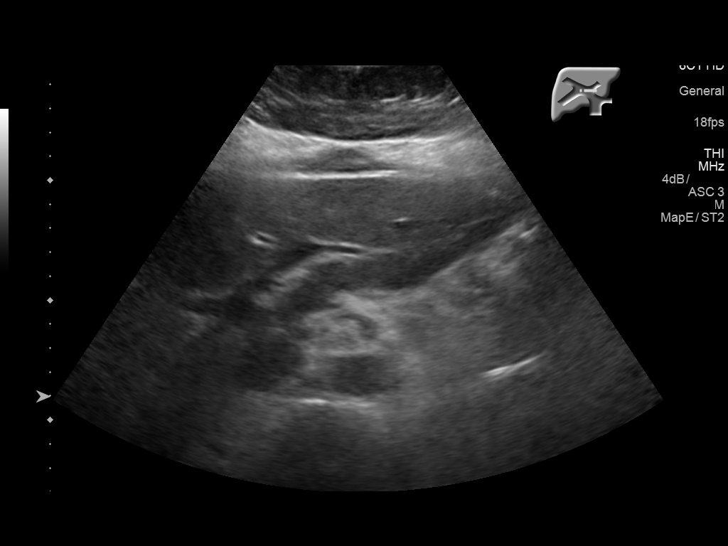
[im 25/33]
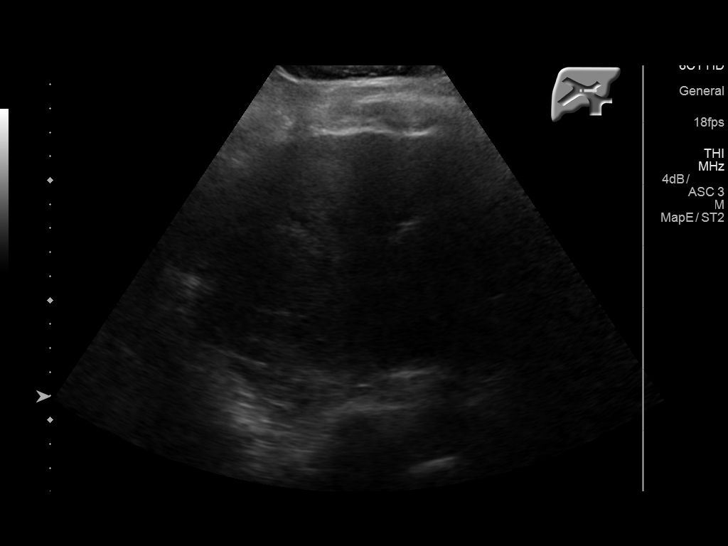
[im 27/33]
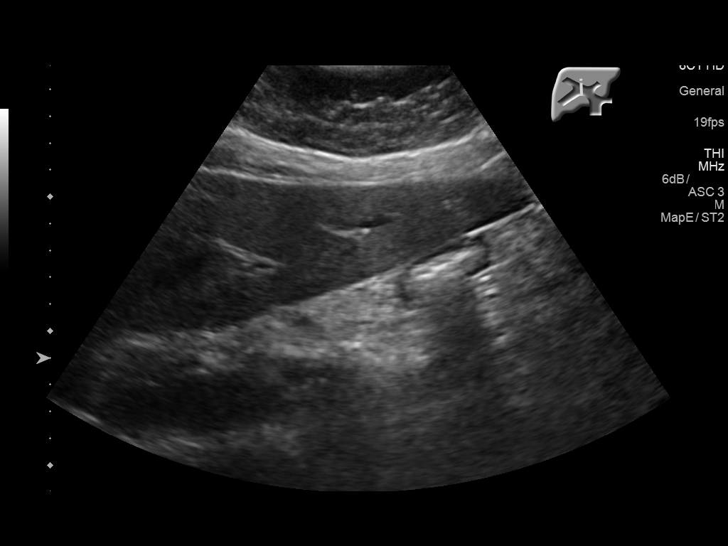
[im 30/33]
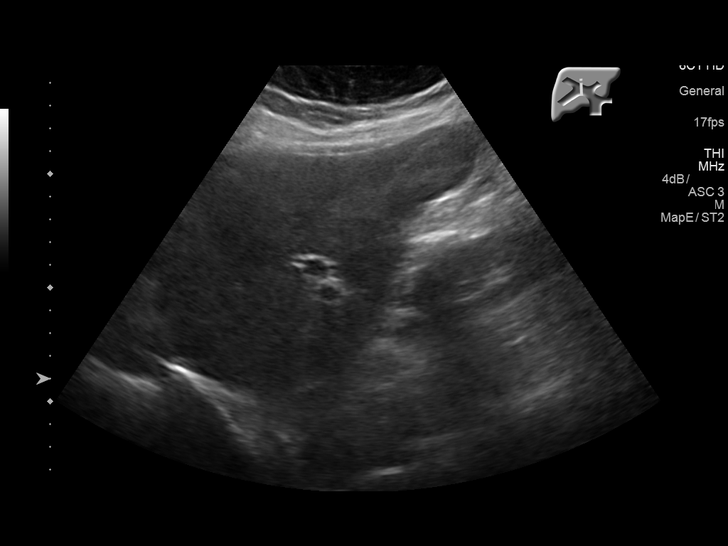
[im 33/33]
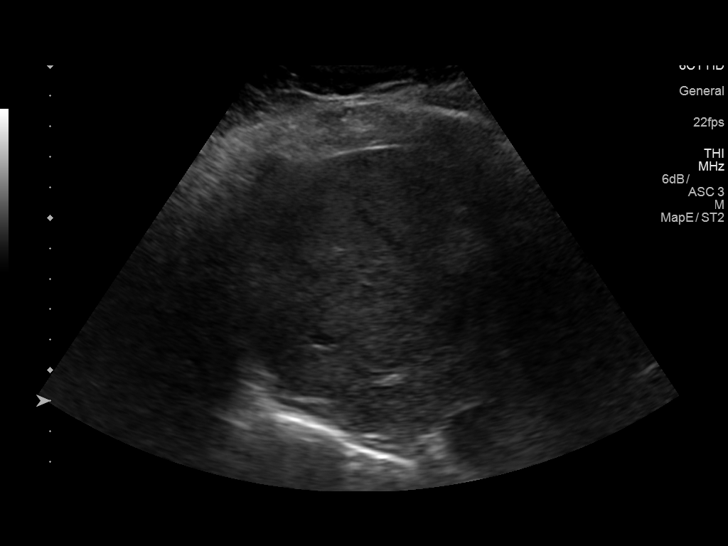

[14 of 25 positions shown; findings below may reference images not displayed]

FINDINGS: Gallbladder:

No gallstones or wall thickening visualized. There is no
pericholecystic fluid. No sonographic Murphy sign noted by
sonographer.

Common bile duct:

Diameter: 4 mm. No intrahepatic or extrahepatic biliary duct
dilatation.

Liver:

No focal lesion identified. Liver echogenicity is overall increased
and somewhat inhomogeneous. Note that the portal vein is not well
visualized due to patient's inability to suspend respiration
sufficient to assess Doppler flow in the portal vein.
IMPRESSION: The appearance of the liver is felt to be indicative of a degree of
hepatic steatosis. While no focal liver lesions are evident on this
study, it must be cautioned that the sensitivity of ultrasound for
detection of focal liver lesions is diminished in this circumstance.
The patient was not able to suspend respiration sufficient to
adequately study the portal vein.

Study otherwise unremarkable.

## 2022-01-27 ENCOUNTER — Encounter (HOSPITAL_BASED_OUTPATIENT_CLINIC_OR_DEPARTMENT_OTHER): Payer: Self-pay | Admitting: Emergency Medicine

## 2022-01-27 ENCOUNTER — Emergency Department (HOSPITAL_BASED_OUTPATIENT_CLINIC_OR_DEPARTMENT_OTHER)
Admission: EM | Admit: 2022-01-27 | Discharge: 2022-01-27 | Discharge status: Against Medical Advice | Payer: Medicaid Other | Attending: Emergency Medicine | Admitting: Emergency Medicine

## 2022-01-27 DIAGNOSIS — R1110 Vomiting, unspecified: Secondary | ICD-10-CM | POA: Insufficient documentation

## 2022-01-27 DIAGNOSIS — R1084 Generalized abdominal pain: Secondary | ICD-10-CM | POA: Insufficient documentation

## 2022-01-27 DIAGNOSIS — Z5321 Procedure and treatment not carried out due to patient leaving prior to being seen by health care provider: Secondary | ICD-10-CM | POA: Insufficient documentation

## 2022-01-27 NOTE — ED Triage Notes (Addendum)
Pt reports vomiting x 3d; now c/o pain to generalized abd and LT flank; pt sts she has had Zofran ODT x 3 since 0100 with no relief ?

## 2022-01-27 NOTE — ED Notes (Signed)
Called patient for bloodwork in lobby, no answer. Registration states patient left AMA. ?

## 2024-07-06 ENCOUNTER — Other Ambulatory Visit: Payer: Self-pay | Admitting: Internal Medicine

## 2024-07-06 DIAGNOSIS — Z1231 Encounter for screening mammogram for malignant neoplasm of breast: Secondary | ICD-10-CM

## 2024-07-20 ENCOUNTER — Ambulatory Visit
Admission: RE | Admit: 2024-07-20 | Discharge: 2024-07-20 | Disposition: A | Discharge status: Home / Self Care | Payer: MEDICAID | Source: Ambulatory Visit | Attending: Internal Medicine | Admitting: Internal Medicine

## 2024-07-20 DIAGNOSIS — Z1231 Encounter for screening mammogram for malignant neoplasm of breast: Secondary | ICD-10-CM

## 2024-07-25 ENCOUNTER — Other Ambulatory Visit: Payer: Self-pay | Admitting: Internal Medicine

## 2024-07-25 DIAGNOSIS — R928 Other abnormal and inconclusive findings on diagnostic imaging of breast: Secondary | ICD-10-CM
